# Patient Record
Sex: Male | Born: 1975 | Race: Black or African American | Hispanic: No | Marital: Married | Smoking: Never smoker
Health system: Southern US, Community
[De-identification: ages and names within clinical notes are randomized; demographics above are authoritative.]

## PROBLEM LIST (undated history)

## (undated) DIAGNOSIS — I639 Cerebral infarction, unspecified: Secondary | ICD-10-CM

## (undated) DIAGNOSIS — R569 Unspecified convulsions: Secondary | ICD-10-CM

## (undated) DIAGNOSIS — I10 Essential (primary) hypertension: Secondary | ICD-10-CM

## (undated) HISTORY — PX: FINGER FRACTURE SURGERY: SHX638

---

## 2021-02-19 ENCOUNTER — Encounter (HOSPITAL_COMMUNITY): Payer: Self-pay

## 2021-02-19 ENCOUNTER — Emergency Department (HOSPITAL_COMMUNITY): Payer: Medicare HMO

## 2021-02-19 ENCOUNTER — Other Ambulatory Visit: Payer: Self-pay

## 2021-02-19 ENCOUNTER — Emergency Department (HOSPITAL_COMMUNITY)
Admission: EM | Admit: 2021-02-19 | Discharge: 2021-02-20 | Disposition: A | Discharge status: Home / Self Care | Payer: Medicare HMO | Attending: Emergency Medicine | Admitting: Emergency Medicine

## 2021-02-19 DIAGNOSIS — W108XXA Fall (on) (from) other stairs and steps, initial encounter: Secondary | ICD-10-CM | POA: Diagnosis not present

## 2021-02-19 DIAGNOSIS — S62522A Displaced fracture of distal phalanx of left thumb, initial encounter for closed fracture: Secondary | ICD-10-CM | POA: Diagnosis not present

## 2021-02-19 DIAGNOSIS — M25532 Pain in left wrist: Secondary | ICD-10-CM | POA: Insufficient documentation

## 2021-02-19 DIAGNOSIS — R519 Headache, unspecified: Secondary | ICD-10-CM | POA: Insufficient documentation

## 2021-02-19 DIAGNOSIS — Y9289 Other specified places as the place of occurrence of the external cause: Secondary | ICD-10-CM | POA: Insufficient documentation

## 2021-02-19 DIAGNOSIS — I10 Essential (primary) hypertension: Secondary | ICD-10-CM | POA: Diagnosis not present

## 2021-02-19 DIAGNOSIS — K8020 Calculus of gallbladder without cholecystitis without obstruction: Secondary | ICD-10-CM | POA: Diagnosis not present

## 2021-02-19 DIAGNOSIS — M542 Cervicalgia: Secondary | ICD-10-CM | POA: Insufficient documentation

## 2021-02-19 DIAGNOSIS — Y9301 Activity, walking, marching and hiking: Secondary | ICD-10-CM | POA: Insufficient documentation

## 2021-02-19 DIAGNOSIS — R52 Pain, unspecified: Secondary | ICD-10-CM

## 2021-02-19 HISTORY — DX: Essential (primary) hypertension: I10

## 2021-02-19 HISTORY — DX: Unspecified convulsions: R56.9

## 2021-02-19 HISTORY — DX: Cerebral infarction, unspecified: I63.9

## 2021-02-19 NOTE — ED Triage Notes (Signed)
Pt BIB EMS from home. Pt fell down 5 steps. Pt now endorses head pain and left wrist pain. Pt denies blood thinners and LOC. 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

## 2021-02-20 ENCOUNTER — Emergency Department (HOSPITAL_BASED_OUTPATIENT_CLINIC_OR_DEPARTMENT_OTHER): Payer: Medicare HMO

## 2021-02-20 ENCOUNTER — Other Ambulatory Visit: Payer: Self-pay

## 2021-02-20 ENCOUNTER — Emergency Department (HOSPITAL_BASED_OUTPATIENT_CLINIC_OR_DEPARTMENT_OTHER): Payer: Medicare HMO | Admitting: Radiology

## 2021-02-20 ENCOUNTER — Encounter (HOSPITAL_BASED_OUTPATIENT_CLINIC_OR_DEPARTMENT_OTHER): Payer: Self-pay

## 2021-02-20 ENCOUNTER — Emergency Department (HOSPITAL_BASED_OUTPATIENT_CLINIC_OR_DEPARTMENT_OTHER)
Admission: EM | Admit: 2021-02-20 | Discharge: 2021-02-20 | Disposition: A | Discharge status: Home / Self Care | Payer: Medicare HMO | Source: Home / Self Care | Attending: Emergency Medicine | Admitting: Emergency Medicine

## 2021-02-20 DIAGNOSIS — W108XXA Fall (on) (from) other stairs and steps, initial encounter: Secondary | ICD-10-CM | POA: Insufficient documentation

## 2021-02-20 DIAGNOSIS — S62522A Displaced fracture of distal phalanx of left thumb, initial encounter for closed fracture: Secondary | ICD-10-CM | POA: Insufficient documentation

## 2021-02-20 DIAGNOSIS — Y9301 Activity, walking, marching and hiking: Secondary | ICD-10-CM | POA: Insufficient documentation

## 2021-02-20 DIAGNOSIS — M542 Cervicalgia: Secondary | ICD-10-CM | POA: Insufficient documentation

## 2021-02-20 DIAGNOSIS — R519 Headache, unspecified: Secondary | ICD-10-CM | POA: Insufficient documentation

## 2021-02-20 DIAGNOSIS — I10 Essential (primary) hypertension: Secondary | ICD-10-CM | POA: Insufficient documentation

## 2021-02-20 DIAGNOSIS — W19XXXA Unspecified fall, initial encounter: Secondary | ICD-10-CM

## 2021-02-20 DIAGNOSIS — K8020 Calculus of gallbladder without cholecystitis without obstruction: Secondary | ICD-10-CM | POA: Insufficient documentation

## 2021-02-20 DIAGNOSIS — Y9289 Other specified places as the place of occurrence of the external cause: Secondary | ICD-10-CM | POA: Insufficient documentation

## 2021-02-20 DIAGNOSIS — R29810 Facial weakness: Secondary | ICD-10-CM | POA: Insufficient documentation

## 2021-02-20 LAB — BASIC METABOLIC PANEL
Anion gap: 8 (ref 5–15)
BUN: 13 mg/dL (ref 6–20)
CO2: 25 mmol/L (ref 22–32)
Calcium: 8.8 mg/dL — ABNORMAL LOW (ref 8.9–10.3)
Chloride: 108 mmol/L (ref 98–111)
Creatinine, Ser: 0.69 mg/dL (ref 0.61–1.24)
GFR, Estimated: >60 mL/min (ref >60)
Glucose, Bld: 90 mg/dL (ref 70–99)
Potassium: 3.7 mmol/L (ref 3.5–5.1)
Sodium: 141 mmol/L (ref 135–145)

## 2021-02-20 MED ORDER — IOHEXOL 350 MG/ML SOLN
100.0000 mL | Freq: Once | INTRAVENOUS | Status: AC | PRN
  Administered 2021-02-20: 100 mL via INTRAVENOUS

## 2021-02-20 MED ORDER — HYDROCODONE-ACETAMINOPHEN 5-325 MG PO TABS
1.0000 | ORAL_TABLET | Freq: Once | ORAL | Status: AC
  Administered 2021-02-20: 1 via ORAL
  Filled 2021-02-20: qty 1

## 2021-02-20 MED ORDER — HYDROCODONE-ACETAMINOPHEN 5-325 MG PO TABS: 1.0000 | ORAL_TABLET | ORAL | 0 refills | Status: DC | PRN

## 2021-02-20 NOTE — ED Notes (Signed)
Pt has been transported to CT at this time  

## 2021-02-20 NOTE — ED Notes (Signed)
Pt is back from CT

## 2021-02-20 NOTE — ED Notes (Signed)
ED Provider at bedside. 

## 2021-02-20 NOTE — ED Notes (Signed)
Pt stated that he was not waiting any longer and left the facility.

## 2021-02-20 NOTE — Discharge Instructions (Addendum)
Take the pain medication as prescribed and followup with Dr. Merlyn Lot regarding your thumb fracture. This may need surgery. Do not take the pain medication if you are working or driving as it may make you sleepy. Return to the ED if you develop new or worsening symptoms.   You should also establish care with a local neurologist for ongoing care from your previous stroke to make sure you are on the proper medications to minimize your risk factors of having another stroke.  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  You can see Dr. Darrick Penna of vascular surgery in the office to discuss whether this should be opened or not.

## 2021-02-20 NOTE — Plan of Care (Signed)
Patient briefly discussed with Dr. Manus Gunning, ED provider as a curbside.  Per EDP he had a remote stroke 15 years ago for which no records are available for either of [place] to review and patient/family are unable to give many details of work-up and etiology except potentially there is a vascular injury at the time.  He had a mechanical fall today for which he was evaluated.  He is at his neurological baseline with mild right-sided weakness.  CTA personally reviewed, agree with radiology read  IMPRESSION: 1. Mild right scalp hematoma with no underlying skull fracture. No other acute traumatic injury identified.   2. 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 The distal Left ICA is partially reconstituted from the Pcomm.   3. Suboptimal CTA contrast bolus with no other arterial abnormality identified in the head or neck.  Given clinical history provided by ED physician, no further acute inpatient neurological work-up would be necessary at this time given the patient is at his neurological baseline and further vascular injury has been adequately ruled out by CTA, with head CT also negative for acute intracranial process.  Discussed that the left carotid occlusion is likely chronic given the history provided as well as the patient's current neurological stability.  These are brief curbside recommendations  Brooke Dare MD-PhD [facility] [phone]

## 2021-02-20 NOTE — ED Triage Notes (Signed)
Patient here POV from another ED Facility for a Mechanical Fall.  Patient fell approximately at 2100 down approximately 10-15 carpet-covered steps. Patient has Pain to Head and Left Thumb.   No LOC, No Hx of Blood Thinners, C-Collar in Place, BIB Wheelchair. History Stroke.

## 2021-02-20 NOTE — ED Provider Notes (Signed)
[facility]   CSN: [id number] Arrival date & time: 02/20/21  [id number]     History Chief Complaint  Patient presents with   Marletta LorFall    Jason Mcfarland is a 45 y.o. male.  Patient with right-sided weakness from previous stroke.  Here after mechanical fall around 9:30 PM.  States he normally walks with a cane and lost his balance and fell down approximately 10-15 carpeted steps.  He somehow ended up on his back with his head facing away from the staircase.  Complains of pain to his head, neck and left thumb.  [facility] left without being seen but did have some x-rays and CT scans. Complains of pain to his left head, right neck, and left thumb.  Denies losing consciousness.  Denies any vomiting.  Denies any blood thinner use. No chest pain or abdominal pain.  No back pain.  No visual changes.  The history is provided by the patient and the spouse.  Fall Associated symptoms include headaches. Pertinent negatives include no chest pain, no abdominal pain and no shortness of breath.      Past Medical History:  Diagnosis Date   Hypertension    Seizures (HCC)    Stroke (HCC)     There are no problems to display for this patient.   History reviewed. No pertinent surgical history.     No family history on file.  Social History   Tobacco Use   Smoking status: Never   Smokeless tobacco: Never  Substance Use Topics   Alcohol use: Never   Drug use: Never    Home Medications Prior to Admission medications   Not on File    Allergies    Patient has no known allergies.  Review of Systems   Review of Systems  Constitutional:  Negative for activity change, appetite change, fatigue and fever.  HENT:  Negative for congestion and rhinorrhea.   Respiratory:  Negative for cough, chest tightness and shortness of breath.   Cardiovascular:  Negative for chest pain.  Gastrointestinal:  Negative for abdominal  pain, nausea and vomiting.  Genitourinary:  Negative for dysuria.  Musculoskeletal:  Positive for arthralgias, back pain, myalgias and neck pain.  Skin:  Negative for wound.  Neurological:  Positive for headaches. Negative for dizziness, weakness and light-headedness.   all other systems are negative except as noted in the HPI and PMH.   Physical Exam Updated Vital Signs BP (!) 156/102 (BP Location: Left Arm)   Pulse 72   Temp 98 F (36.7 C) (Oral)   Resp 16   Ht 6\' 6"  (1.981 m)   Wt 108.9 kg   SpO2 98%   BMI 27.74 kg/m   Physical Exam Vitals and nursing note reviewed.  Constitutional:      General: He is not in acute distress.    Appearance: He is well-developed.  HENT:     Head: Normocephalic and atraumatic.     Ears:     Comments: No septal hematoma or hemotympanum    Mouth/Throat:     Pharynx: No oropharyngeal exudate.  Eyes:     Conjunctiva/sclera: Conjunctivae normal.     Pupils: Pupils are equal, round, and reactive to light.  Neck:     Comments: C-collar in place, right paraspinal tenderness, no midline tenderness or step-off Cardiovascular:     Rate and Rhythm: Normal rate and regular rhythm.     Heart sounds: Normal heart sounds. No murmur  heard. Pulmonary:     Effort: Pulmonary effort is normal. No respiratory distress.     Breath sounds: Normal breath sounds.     Comments: No ecchymosis Chest:     Chest wall: No tenderness.  Abdominal:     Palpations: Abdomen is soft.     Tenderness: There is no abdominal tenderness. There is no guarding or rebound.     Comments: No ecchymosis   Musculoskeletal:        General: No tenderness. Normal range of motion.     Cervical back: Normal range of motion and neck supple.     Comments: No T or L-spine tenderness, full range of motion of hips without pain.  Swelling and ecchymosis to left thumb with reduced range of motion.  No snuffbox tenderness.  Full range of motion of bilateral shoulders, elbows, wrists,  hips, knees and ankles.  Skin:    General: Skin is warm.  Neurological:     Mental Status: He is alert and oriented to person, place, and time.     Cranial Nerves: No cranial nerve deficit.     Motor: No abnormal muscle tone.     Coordination: Coordination normal.     Comments: Right-sided facial droop, weakness in right arm and right leg at baseline.  5/5 strength in the left  Psychiatric:        Behavior: Behavior normal.    ED Results / Procedures / Treatments   Labs (all labs ordered are listed, but only abnormal results are displayed) Labs Reviewed  BASIC METABOLIC PANEL - Abnormal; Notable for the following components:      Result Value   Calcium 8.8 (*)    All other components within normal limits    EKG None  Radiology CT Head Wo Contrast  Result Date: 02/19/2021 CLINICAL DATA:  45 year old male with head trauma. EXAM: CT HEAD WITHOUT CONTRAST CT CERVICAL SPINE WITHOUT CONTRAST TECHNIQUE: Multidetector CT imaging of the head and cervical spine was performed following the standard protocol without intravenous contrast. Multiplanar CT image reconstructions of the cervical spine were also generated. COMPARISON:  None. FINDINGS: CT HEAD FINDINGS Brain: There is a large area of old infarct and encephalomalacia involving the left frontal and parietal lobes. There is no acute intracranial hemorrhage. No mass effect or midline shift. No extra-axial fluid collection. Vascular: No hyperdense vessel or unexpected calcification. Skull: Normal. Negative for fracture or focal lesion. Sinuses/Orbits: No acute finding. Other: None CT CERVICAL SPINE FINDINGS Alignment: There is mild reversal of normal cervical lordosis which may be positional or due to muscle spasm. No acute subluxation. Skull base and vertebrae: No acute fracture. Soft tissues and spinal canal: No prevertebral fluid or swelling. No visible canal hematoma. Disc levels: Degenerative changes with disc space narrowing and anterior  osteophyte primarily at C4-C6. Upper chest: Negative. Other: None IMPRESSION: 1. No acute intracranial pathology. 2. Large area of old infarct and encephalomalacia involving the left frontal and parietal lobes. 3. No acute/traumatic cervical spine pathology. Electronically Signed   By: Elgie Collard M.D.   On: 02/19/2021 23:12   CT Cervical Spine Wo Contrast  Result Date: 02/19/2021 CLINICAL DATA:  45 year old male with head trauma. EXAM: CT HEAD WITHOUT CONTRAST CT CERVICAL SPINE WITHOUT CONTRAST TECHNIQUE: Multidetector CT imaging of the head and cervical spine was performed following the standard protocol without intravenous contrast. Multiplanar CT image reconstructions of the cervical spine were also generated. COMPARISON:  None. FINDINGS: CT HEAD FINDINGS Brain: There is a large area  of old infarct and encephalomalacia involving the left frontal and parietal lobes. There is no acute intracranial hemorrhage. No mass effect or midline shift. No extra-axial fluid collection. Vascular: No hyperdense vessel or unexpected calcification. Skull: Normal. Negative for fracture or focal lesion. Sinuses/Orbits: No acute finding. Other: None CT CERVICAL SPINE FINDINGS Alignment: There is mild reversal of normal cervical lordosis which may be positional or due to muscle spasm. No acute subluxation. Skull base and vertebrae: No acute fracture. Soft tissues and spinal canal: No prevertebral fluid or swelling. No visible canal hematoma. Disc levels: Degenerative changes with disc space narrowing and anterior osteophyte primarily at C4-C6. Upper chest: Negative. Other: None IMPRESSION: 1. No acute intracranial pathology. 2. Large area of old infarct and encephalomalacia involving the left frontal and parietal lobes. 3. No acute/traumatic cervical spine pathology. Electronically Signed   By: Elgie Collard M.D.   On: 02/19/2021 23:12   DG Hand Complete Left  Result Date: 02/19/2021 CLINICAL DATA:  Fall down steps.   Left thumb injury. EXAM: LEFT HAND - COMPLETE 3+ VIEW COMPARISON:  None. FINDINGS: Comminuted fracture noted through the distal phalanx of the left thumb with intra-articular extension. No subluxation or dislocation. Soft tissues are intact. IMPRESSION: Comminuted intra-articular fracture of the left thumb distal phalanx. Electronically Signed   By: Charlett Nose M.D.   On: 02/19/2021 23:28    Procedures Procedures   Medications Ordered in ED Medications  HYDROcodone-acetaminophen (NORCO/VICODIN) 5-325 MG per tablet 1 tablet (has no administration in time range)    ED Course  I have reviewed the triage vital signs and the nursing notes.  Pertinent labs & imaging results that were available during my care of the patient were reviewed by me and considered in my medical decision making (see chart for details).    MDM Rules/Calculators/A&P                         Fall downstairs with head, neck, left thumb injury.  No loss of consciousness.  No blood thinner use.  CT head and C-spine obtained in outside facility and are negative for acute pathology. No midline C-spine tenderness and c-collar was removed.  X-ray of hand concerning for intraarticular fracture of distal phalanx of left thumb.  D/w Dr. Merlyn Lot of hand surgery.  Recommends immobilization with aluminum splint and Coban.  The spica splint will not reach fracture site.  He was seen in follow-up.  Is not certain whether patient will need surgery.  Patient and wife are concerned about vascular injury to his neck.  Apparently his previous stroke was caused by this. Discussed that his traumatic imaging is reassuring and his neurological exam is unchanged. Discussed that we can obtain CTA imaging of head and neck to ensure there is no traumatic injury if this will make them feel more comfortable  CTA head and neck negative for acute traumatic injury.  Does show chronic infarction of left renal hemisphere with function occluded left ICA  poorly enhancing left MCA and ACA branches.  Patient reports his initial stroke was in 2004 and he has had right-sided weakness since then.  Does not recall ever being referred to vascular surgeon for endarterectomy. His right-sided weakness today is unchanged and no worse.  D/w Dr. Iver Nestle of neurology who reviewed images.  She agrees that this carotid artery is likely chronically occluded and led to his initial stroke.  He does have collateral blood flow through the circle of Willis.  She  does not recommend any acute intervention.  Normally chronically occluded arteries are not opened as they can cause further damage.  Recommends outpatient follow-up with neurology as well as vascular surgery though she does not believe he would be a candidate for vascular intervention. Patient and wife confirmed that there is no acute change in his neurological exam.  Discussed with patient and wife.  Will refer to local neurology to assess for follow-up to make sure his risk factors for recurrent stroke going forward are minimized. Also referred to vascular surgery to determine if any utility to endarterectomy.  Follow-up with neurology as well as hand surgery. Return precautions discussed  Final Clinical Impression(s) / ED Diagnoses Final diagnoses:  Fall, initial encounter  Closed displaced fracture of distal phalanx of left thumb, initial encounter    Rx / DC Orders ED Discharge Orders     None        Nyjai Graff, Jeannett Senior, MD 02/20/21 [phone]

## 2021-02-22 ENCOUNTER — Encounter: Payer: Self-pay | Admitting: Neurology

## 2021-02-28 ENCOUNTER — Ambulatory Visit (INDEPENDENT_AMBULATORY_CARE_PROVIDER_SITE_OTHER): Payer: Medicare HMO | Admitting: Vascular Surgery

## 2021-02-28 ENCOUNTER — Encounter: Payer: Self-pay | Admitting: Vascular Surgery

## 2021-02-28 ENCOUNTER — Other Ambulatory Visit: Payer: Self-pay

## 2021-02-28 VITALS — BP 147/96 | HR 57 | Temp 98.1°F | Resp 18 | Ht 78.0 in | Wt 251.0 lb

## 2021-02-28 DIAGNOSIS — I6522 Occlusion and stenosis of left carotid artery: Secondary | ICD-10-CM

## 2021-02-28 NOTE — Progress Notes (Signed)
Referring Physician: [facility]  Patient name: Jason Mcfarland MRN: [id number] DOB: 1975/10/05 Sex: male  REASON FOR CONSULT: Left carotid stenosis  HPI: Jason Mcfarland is a 45 y.o. male, recently had a fall with a fracture in his left hand.  He also hit his head.  CT angiogram revealed subtotal functional occlusion of the left internal carotid artery.  He was referred here for further evaluation of this.  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  He has recovered somewhat but still has some difficulty with speech has minimal function of his right arm and his right foot drag some when he walks.  He usually walks with the assistance of a cane.  He is on daily aspirin.  He also has a history of hypertension.  This is currently controlled.  He has had no new neurologic symptoms.  Past Medical History:  Diagnosis Date   Hypertension    Seizures (HCC)    Stroke [facility])    History reviewed. No pertinent surgical history.  History reviewed. No pertinent family history.  SOCIAL HISTORY: Social History   Socioeconomic History   Marital status: Married    Spouse name: Not on file   Number of children: Not on file   Years of education: Not on file   Highest education level: Not on file  Occupational History   Not on file  Tobacco Use   Smoking status: Never   Smokeless tobacco: Never  Substance and Sexual Activity   Alcohol use: Never   Drug use: Never   Sexual activity: Yes  Other Topics Concern   Not on file  Social History Narrative   Not on file   Social Determinants of Health   Financial Resource Strain: Not on file  Food Insecurity: Not on file  Transportation Needs: Not on file  Physical Activity: Not on file  Stress: Not on file  Social Connections: Not on file  Intimate Partner Violence: Not on file    No Known Allergies  Current Outpatient Medications  Medication Sig Dispense Refill   amLODipine (NORVASC) 5 MG tablet  Take by mouth.     aspirin 81 MG EC tablet Take by mouth.     HYDROcodone-acetaminophen (NORCO/VICODIN) 5-325 MG tablet Take 1 tablet by mouth every 4 (four) hours as needed. 10 tablet 0   phenytoin (DILANTIN) 100 MG ER capsule Take 3 capsules by mouth 2 (two) times daily.     sertraline (ZOLOFT) 50 MG tablet Take by mouth.     No current facility-administered medications for this visit.    ROS:   General:  No weight loss, Fever, chills  HEENT: No recent headaches, no nasal bleeding, no visual changes, no sore throat  Neurologic: No dizziness, blackouts, seizures. No recent symptoms of stroke or mini- stroke. No recent episodes of slurred speech, or temporary blindness.  Cardiac: No recent episodes of chest pain/pressure, no shortness of breath at rest.  No shortness of breath with exertion.  Denies history of atrial fibrillation or irregular heartbeat  Vascular: No history of rest pain in feet.  No history of claudication.  No history of non-healing ulcer, No history of DVT   Pulmonary: No home oxygen, no productive cough, no hemoptysis,  No asthma or wheezing  Musculoskeletal:  [ ]  Arthritis, [ ]  Low back pain,  [ ]  Joint pain  Hematologic:No history of hypercoagulable state.  No history of easy bleeding.  No history of anemia  Gastrointestinal: No hematochezia  or melena,  No gastroesophageal reflux, no trouble swallowing  Urinary: [ ]  chronic Kidney disease, [ ]  on HD - [ ]  MWF or [ ]  TTHS, [ ]  Burning with urination, [ ]  Frequent urination, [ ]  Difficulty urinating;   Skin: No rashes  Psychological: No history of anxiety,  No history of depression   Physical Examination  Vitals:   02/28/21 1129  BP: (!) 147/96  Pulse: (!) 57  Resp: 18  Temp: 98.1 F (36.7 C)  TempSrc: Temporal  SpO2: 97%  Weight: 251 lb (113.9 kg)  Height: 6\' 6"  (1.981 m)    Body mass index is 29.01 kg/m.  General:  Alert and oriented, no acute distress HEENT: Normal Neck: No JVD Cardiac:  Regular Rate and Rhythm Skin: No rash Musculoskeletal: No deformity or edema  Neurologic: Upper and lower extremity motor left side 5/5, right side flaccid paralysis right hand some movement of the right elbow and shoulder 4/5 motor function in the right leg minimal function of the right foot and ankle left side has totally normal neurologic exam.  He has some facial asymmetry with right facial droop and numbness.    DATA:  Reviewed the images from the patient's recent CT angio of the head and neck dated February 20, 2021.  This shows subtotal occlusion within the cavernous portion of the left internal carotid artery and basically is functionally occluded.  Vertebral arteries are patent.  Right carotid system has mild tortuosity but no narrowing.  ASSESSMENT: Functionally occluded left internal carotid artery which probably caused his stroke 18 years ago.  Although I cannot definitively say this the age and timing of this probably were related to a carotid dissection rather than atherosclerotic disease.  No intervention for this currently.  Risk of recurrent stroke from this carotid artery should be fairly minimal.   PLAN: Patient could continue to modify his risk factors by making sure his cholesterol stays low controlling his blood pressure and continuing daily aspirin.  All of these were discussed with the patient's wife by phone as well.  They would like to have intermittent surveillance to make sure that he does not develop a new problem with his right carotid artery.  In light of this we will arrange for a follow-up visit in 1 year time in our APP clinic and a carotid duplex exam.  He will follow-up sooner if he has any symptoms.   , MD [facility] Office: [phone]

## 2021-05-15 ENCOUNTER — Ambulatory Visit: Payer: Medicare HMO | Admitting: Neurology

## 2021-08-07 NOTE — Progress Notes (Signed)
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  Discussed potential for rebound headache and should be taken sparingly.  Advised not to take such high doses of ibuprofen due to bleeding risk, effects on kidneys and GI upset. 2  Currently taking phenytoin ER 300mg  twice daily.  Managed and monitored by his PCP.  Discussed possibly changing to an alternative AED such as levetiracetam.  As he has been doing well on phenytoin and doesn't want to risk a seizure, he declines which is fine.  3  Secondary stroke prevention as managed by PCP:  - ASA 81mg  daily  - Normotensive blood pressure  - cholesterol and glycemic control 4  Follow up in 7-8 months.   Subjective:  Jason Mcfarland is a 45 year old male with HTN and residual right sided weakness due to previous stroke who presents for headache.  History supplemented by his wife via telephone and by ED note.  Patient sustained a left hemispheric stroke in 2004.  He was  hit while playing basketball.  Afterwards, he collapsed and had a stroke.  He was briefly on Coumadin which was discontinued due to bleeding.  Soon after the stroke, he started having seizures.  He was started on phenytoin and hasn't had a recurrent seizure since then.  His PCP monitors his labs and level.  He has residual expressive aphasia and right hemiparesis due to the stroke.  Since the stroke he has had  headaches.  On 02/19/2021, he slipped down the stairs and hit his head.  He was seen in the ED.  CT head showed old large left frontal and parietal infarct but no acute findings.  CT cervical spine showed degenerative changes, primarily at C4-6 but no acute fractures.  CTA head and neck showed occluded left ICA with poorly enhancing left MCA and ACA branches, likely chronic.  He did follow up with vascular surgery who recommended monitoring via carotid ultrasound.  Since the fall, headaches have gotten worse.  He describes severe headache occurring once a month.  No associated nausea, vomiting, photophobia, phonophobia or visual disturbance.  Treats with four 800mg  ibuprofen.    Current medications:  phenytoin ER 300mg  twice daily, sertraline 50mg  daily, hydrocodone-acetaminophen, amlodipine, ASA 81mg  daily   PAST MEDICAL HISTORY: Past Medical History:  Diagnosis Date   Hypertension    Seizures (HCC)    Stroke (HCC)     PAST SURGICAL HISTORY: No past surgical history on file.  MEDICATIONS: Current Outpatient Medications on File Prior to Visit  Medication Sig Dispense Refill   amLODipine (NORVASC) 5 MG tablet Take by mouth.     aspirin 81 MG EC tablet Take by mouth.     HYDROcodone-acetaminophen (NORCO/VICODIN) 5-325 MG tablet Take 1 tablet by mouth every 4 (four) hours as needed. 10 tablet 0   phenytoin (DILANTIN) 100 MG ER capsule Take 3 capsules by mouth 2 (two) times daily.     sertraline (ZOLOFT) 50 MG tablet Take by mouth.     No current facility-administered medications on file prior to visit.    ALLERGIES: No  Known Allergies  FAMILY HISTORY: No family history on file.  Objective:  Blood pressure (!) 155/90, pulse 91, height 6\' 6"  (1.981 m), weight 265 lb (120.2 kg), SpO2 97 %. General: No acute distress.  Patient appears well-groomed.   Head:  Normocephalic/atraumatic Eyes:  fundi examined but not visualized Neck: supple, no paraspinal tenderness, full range of  motion Back: No paraspinal tenderness Heart: regular rate and rhythm Lungs: Clear to auscultation bilaterally. Vascular: No carotid bruits. Neurological Exam: Mental status: alert and oriented to person, place, and time, expressive aphasia.  Not dysarthric. Cranial nerves: CN I: not tested CN II: pupils equal, round and reactive to light, visual fields intact CN III, IV, VI:  full range of motion, no nystagmus, no ptosis CN V: facial sensation reduced right V1-V3 CN VII: upper and lower face symmetric CN VIII: hearing intact CN IX, X: gag intact, uvula midline CN XI: sternocleidomastoid and trapezius muscles intact CN XII: tongue midline Bulk & Tone: increased right upper and lower extremities Motor:  muscle strength 4/5 right upper extremity, right foot drop, otherwise 5/5 throughout Sensation:  Pinprick, temperature and vibratory sensation intact. Deep Tendon Reflexes:  3+ right upper and lower extremities, 2+ left upper and lower extremities,  toes downgoing.   Finger to nose testing:  Slow on right but without dysmetria.    Gait:  Right hemiplegic gait.  Romberg negative.    Thank you for allowing me to take part in the care of this patient.  Metta Clines, DO   CC:  Wilburn Mylar, MD

## 2021-08-08 ENCOUNTER — Encounter: Payer: Self-pay | Admitting: Neurology

## 2021-08-08 ENCOUNTER — Other Ambulatory Visit: Payer: Self-pay

## 2021-08-08 ENCOUNTER — Ambulatory Visit: Payer: Medicare HMO | Admitting: Neurology

## 2021-08-08 VITALS — BP 155/90 | HR 91 | Ht 78.0 in | Wt 265.0 lb

## 2021-08-08 DIAGNOSIS — I69351 Hemiplegia and hemiparesis following cerebral infarction affecting right dominant side: Secondary | ICD-10-CM

## 2021-08-08 DIAGNOSIS — I69320 Aphasia following cerebral infarction: Secondary | ICD-10-CM | POA: Diagnosis not present

## 2021-08-08 DIAGNOSIS — I63232 Cerebral infarction due to unspecified occlusion or stenosis of left carotid arteries: Secondary | ICD-10-CM

## 2021-08-08 DIAGNOSIS — R519 Headache, unspecified: Secondary | ICD-10-CM

## 2021-08-08 DIAGNOSIS — G40109 Localization-related (focal) (partial) symptomatic epilepsy and epileptic syndromes with simple partial seizures, not intractable, without status epilepticus: Secondary | ICD-10-CM | POA: Diagnosis not present

## 2021-08-08 DIAGNOSIS — I10 Essential (primary) hypertension: Secondary | ICD-10-CM

## 2021-08-08 MED ORDER — BUTALBITAL-APAP-CAFFEINE 50-325-40 MG PO TABS: 1.0000 | ORAL_TABLET | Freq: Four times a day (QID) | ORAL | 5 refills | Status: AC | PRN

## 2021-08-08 NOTE — Patient Instructions (Signed)
Take butalbital-caffeine-acetaminophen as directed. Take no more than 2 days out of the month.  Do not take ibuprofen. Limit use of pain relievers to no more than 2 days out of week to prevent risk of rebound or medication-overuse headache. Follow up 7-8 months

## 2021-08-09 ENCOUNTER — Encounter: Payer: Self-pay | Admitting: Neurology

## 2021-12-08 IMAGING — CT CT ANGIO NECK
1 of 13 series · 4 of 33 positions shown · IV contrast (APPLIED)
Comparison: [HOSPITAL] head and cervical spine CT
02/19/2021

CLINICAL DATA: 45-year-old male status post fall down steps at 6088
hours. Persistent pain.

EXAM:
CT ANGIOGRAPHY HEAD AND NECK
TECHNIQUE: Multidetector CT imaging of the head and neck was performed using
the standard protocol during bolus administration of intravenous
contrast. Multiplanar CT image reconstructions and MIPs were
obtained to evaluate the vascular anatomy. Carotid stenosis
measurements (when applicable) are obtained utilizing NASCET
criteria, using the distal internal carotid diameter as the
denominator.
CONTRAST:  100mL OMNIPAQUE IOHEXOL 350 MG/ML SOLN

[Series 13: cta head & neck · axial · 0.59mm/px · z∈[-327,-116]mm · 4 of 741 slices shown]
[im 149/741  soft-tissue]
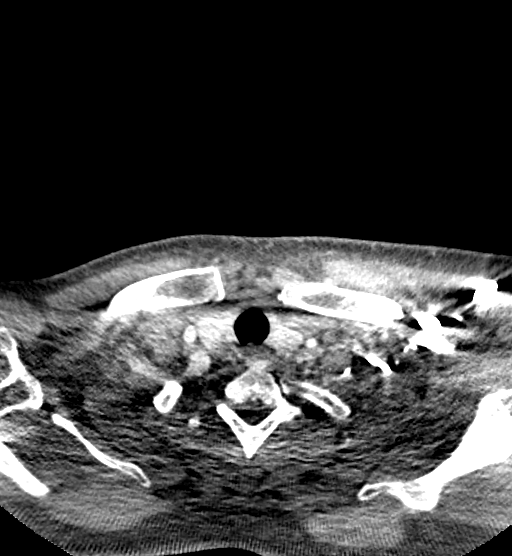
[im 297/741  bone]
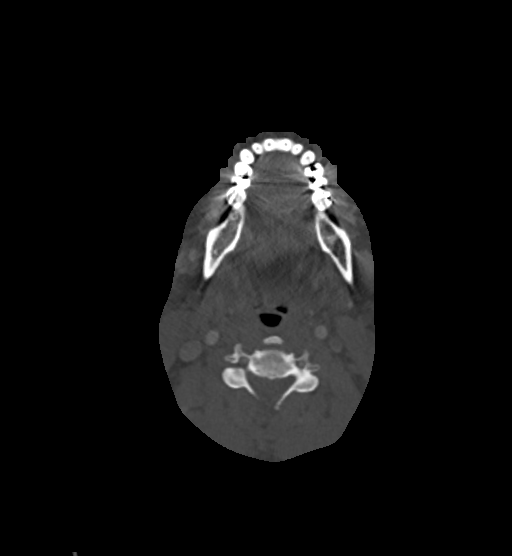
[im 445/741  soft-tissue]
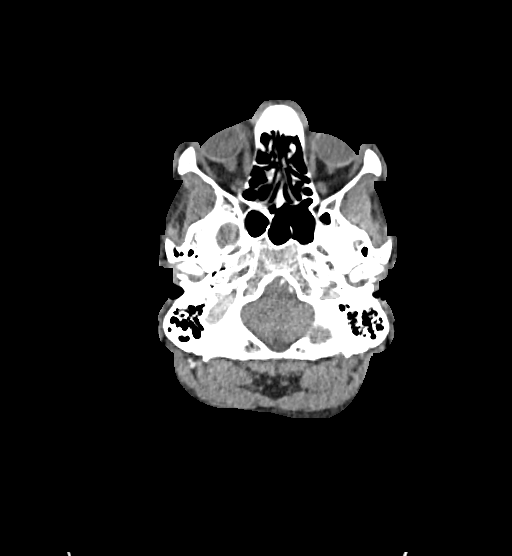
[im 593/741  bone]
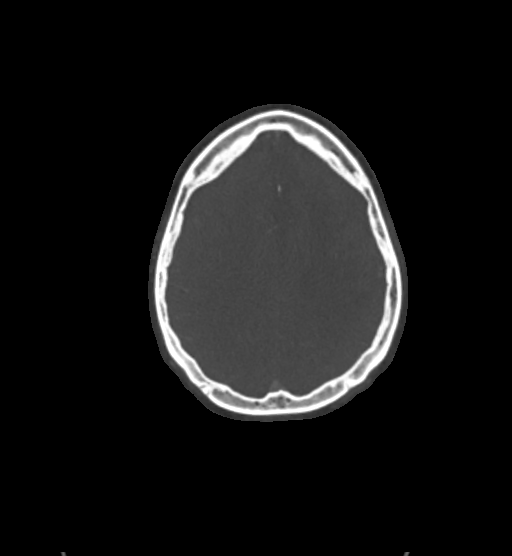

[4 of 33 positions shown; findings below may reference images not displayed]

FINDINGS: CT HEAD

Brain: Pronounced left frontal and parietal lobe encephalomalacia
with ex vacuo enlargement of the left lateral ventricle, and trace
midline shift toward the smaller hemisphere. Stable gray-white
matter differentiation throughout the brain.

No ventriculomegaly, evidence of mass lesion, intracranial
hemorrhage, or evidence of cortically based acute infarction.

Calvarium and skull base: Stable and intact.

Paranasal sinuses: Visualized paranasal sinuses and mastoids are
stable and well aerated.

Orbits: Right superior convexity mild scalp hematoma or contusion on
series 6, image 28 is not significantly changed. Underlying
calvarium intact. Visualized orbit soft tissues are within normal
limits.

CTA NECK

Skeleton: Stable reversal of cervical lordosis since 4400 hours
yesterday. No acute osseous abnormality identified.

Upper chest: Minor atelectasis.

Other neck: The glottis is closed. No discrete soft tissue injury is
identified in the neck.

Aortic arch: Suboptimal contrast bolus timing. Three vessel arch
configuration with no arch atherosclerosis.

Right carotid system: Negative aside from mild tortuosity of the
right ICA just below the skull base.

Left carotid system: Suboptimal contrast bolus timing but the left
CCA appears to be patent and within normal limits to the
bifurcation. At the bifurcation the left ICA origin and bulb are
patent, but the vessel abruptly tapers to a string sign at the
distal bulb (series 19, image 35) and remains thread-like to the
skull base (series 12, image 81).

Vertebral arteries:
Proximal right subclavian artery and right vertebral artery origin
appear normal. The right vertebral is patent to the skull base and
within normal limits.

Suboptimal contrast timing. The proximal left subclavian artery and
left vertebral artery origin appear grossly normal. The left
vertebral artery appears non dominant but is patent and within
normal limits to the skull base.

CTA HEAD

Posterior circulation: Patent distal vertebral arteries and basilar
with no plaque or stenosis identified. Patent basilar tip, PCA
origins. Bilateral posterior communicating arteries are present.
Left PCA branches are within normal limits. The right PCA branches
appear mildly irregular but otherwise normal.

Anterior circulation: Poorly enhancing left ICA siphon proximal to
the supraclinoid segment where some reconstitution from the left
posterior communicating artery is suspected. Contralateral right ICA
siphon is patent with no plaque or stenosis identified. Both
posterior communicating artery origins appear normal.

Patent carotid termini, although dominant right and diminutive or
absent left ACA A1 segments. And furthermore the left ACA is
diminutive and poorly enhancing beyond the proximal A2 segment as
seen on series 18, image 18. contralateral right ACA branches are
within normal limits.

Right MCA M1 segment and bifurcation are patent without stenosis.
Right MCA branches appear within normal limits.

Contralateral left MCA M1 is patent but diminutive (series 17, image
25) with a tapered appearance of the left MCA bi/trifurcation. And
decreased size and enhancement of all left MCA branches (series 19,
image 35).

Venous sinuses: Patent.

Anatomic variants: Dominant right and diminutive or absent left ACA
A1 segments.

Review of the MIP images confirms the above findings
IMPRESSION: 1. Mild right scalp hematoma with no underlying skull fracture. No
other acute traumatic injury identified.

2. Positive for chronic infarction of the left cerebral hemisphere
with a functionally occluded cervical left ICA, and poorly enhancing
left MCA and ACA branches. The distal Left ICA is partially
reconstituted from the Pcomm.

3. Suboptimal CTA contrast bolus with no other arterial abnormality
identified in the head or neck.

## 2022-03-28 ENCOUNTER — Ambulatory Visit
Admission: EM | Admit: 2022-03-28 | Discharge: 2022-03-28 | Disposition: A | Discharge status: Home / Self Care | Payer: Medicare HMO | Attending: Physician Assistant | Admitting: Physician Assistant

## 2022-03-28 DIAGNOSIS — H60502 Unspecified acute noninfective otitis externa, left ear: Secondary | ICD-10-CM | POA: Diagnosis not present

## 2022-03-28 DIAGNOSIS — H6122 Impacted cerumen, left ear: Secondary | ICD-10-CM | POA: Diagnosis not present

## 2022-03-28 MED ORDER — OFLOXACIN 0.3 % OT SOLN: 5.0000 [drp] | Freq: Two times a day (BID) | OTIC | 0 refills | Status: AC

## 2022-03-28 NOTE — Discharge Instructions (Signed)
We were able to clean the earwax out of your ear.  Please avoid using Q-tips, earbuds, earplugs.  Your ear canal looked irritated likely from this procedure.  Use ofloxacin twice daily.  Keep ear facing upward a few minutes after applying medication to allow it to completely penetrate through the ear canal.  If symptoms recur you can use Debrox over-the-counter.  If you develop any worsening symptoms including drainage from the ear, pain, fever, nausea, vomiting you should be seen immediately.

## 2022-03-28 NOTE — ED Provider Notes (Signed)
[facility]    CSN: [id number] Arrival date & time: 03/28/22  1320      History   Chief Complaint Chief Complaint  Patient presents with   Ear Fullness    HPI Jason Mcfarland is a 46 y.o. male.   Patient presents today with a weeklong history of left ear fullness.  He reports decreased hearing.  He has a history of recurrent cerumen impaction with similar presentation.  He does report using Q-tips occasionally but does not use earbuds or earplugs.  Denies any recent swimming.  Denies any recent airplane travel or barotrauma.  He denies any recent illness or additional symptoms including otalgia, otorrhea, fever, nausea, vomiting.  He has not seen an ENT recently.  He has not tried any over-the-counter medication for symptom management.    Past Medical History:  Diagnosis Date   Hypertension    Seizures (HCC)    Stroke (HCC)     There are no problems to display for this patient.   Past Surgical History:  Procedure Laterality Date   FINGER FRACTURE SURGERY         Home Medications    Prior to Admission medications   Medication Sig Start Date End Date Taking? Authorizing Provider  ofloxacin (FLOXIN) 0.3 % OTIC solution Place 5 drops into the left ear 2 (two) times daily. 03/28/22  Yes Shane Badeaux K, PA-C  amLODipine (NORVASC) 5 MG tablet Take by mouth. 01/19/21   [provider]  aspirin 81 MG EC tablet Take by mouth.    [provider]  butalbital-acetaminophen-caffeine (FIORICET) [phone] MG tablet Take 1-2 tablets by mouth every 6 (six) hours as needed for headache. 08/08/21 08/08/22  Drema Dallas, DO  phenytoin (DILANTIN) 100 MG ER capsule Take 3 capsules by mouth 2 (two) times daily. 11/29/20   [provider]  sertraline (ZOLOFT) 50 MG tablet Take by mouth. 01/15/21   [provider]  tadalafil (CIALIS) 20 MG tablet Take 20 mg by mouth as needed. 01/19/21   [provider]    Family History Family History   Problem Relation Age of Onset   Seizures Father     Social History Social History   Tobacco Use   Smoking status: Never   Smokeless tobacco: Never  Substance Use Topics   Alcohol use: Never   Drug use: Never     Allergies   Patient has no known allergies.   Review of Systems Review of Systems  Constitutional:  Positive for activity change. Negative for appetite change, fatigue and fever.  HENT:  Positive for hearing loss. Negative for congestion, ear discharge, ear pain, sinus pressure, sneezing and sore throat.   Respiratory:  Negative for cough and shortness of breath.   Cardiovascular:  Negative for chest pain.     Physical Exam Triage Vital Signs ED Triage Vitals  Enc Vitals Group     BP 03/28/22 1444 (!) 138/94     Pulse Rate 03/28/22 1444 95     Resp 03/28/22 1444 18     Temp 03/28/22 1444 97.7 F (36.5 C)     Temp Source 03/28/22 1444 Oral     SpO2 03/28/22 1444 97 %     Weight --      Height --      Head Circumference --      Peak Flow --      Pain Score 03/28/22 1443 0     Pain Loc --  Pain Edu? --      Excl. in GC? --    No data found.  Updated Vital Signs BP (!) 138/94 (BP Location: Left Arm) Comment: no blood draws or sticks to right arm (hx of stroke)  Pulse 95   Temp 97.7 F (36.5 C) (Oral)   Resp 18   SpO2 97%   Visual Acuity Right Eye Distance:   Left Eye Distance:   Bilateral Distance:    Right Eye Near:   Left Eye Near:    Bilateral Near:     Physical Exam Vitals reviewed.  Constitutional:      General: He is awake.     Appearance: Normal appearance. He is well-developed. He is not ill-appearing.     Comments: Very pleasant male appears stated age in no acute distress sitting comfortably in exam room  HENT:     Head: Normocephalic and atraumatic.     Right Ear: Tympanic membrane, ear canal and external ear normal. Tympanic membrane is not erythematous or bulging.     Left Ear: Ear canal and external ear normal. A  middle ear effusion is present.     Ears:     Comments: Cerumen impaction noted in left ear; unable to visualize TM.  Cerumen resolved with in office irrigation revealing normal TM but erythematous ear canal.    Nose: Nose normal.     Mouth/Throat:     Pharynx: Uvula midline. Posterior oropharyngeal erythema present. No oropharyngeal exudate or uvula swelling.  Cardiovascular:     Rate and Rhythm: Normal rate and regular rhythm.     Heart sounds: Normal heart sounds, S1 normal and S2 normal. No murmur heard. Pulmonary:     Effort: Pulmonary effort is normal. No accessory muscle usage or respiratory distress.     Breath sounds: Normal breath sounds. No stridor. No wheezing, rhonchi or rales.     Comments: Clear to auscultation bilaterally Abdominal:     General: Bowel sounds are normal.     Palpations: Abdomen is soft.     Tenderness: There is no abdominal tenderness.  Neurological:     Mental Status: He is alert.  Psychiatric:        Behavior: Behavior is cooperative.      UC Treatments / Results  Labs (all labs ordered are listed, but only abnormal results are displayed) Labs Reviewed - No data to display  EKG   Radiology No results found.  Procedures Procedures (including critical care time)  Medications Ordered in UC Medications - No data to display  Initial Impression / Assessment and Plan / UC Course  I have reviewed the triage vital signs and the nursing notes.  Pertinent labs & imaging results that were available during my care of the patient were reviewed by me and considered in my medical decision making (see chart for details).     Cerumen impaction resolved with in office irrigation.  Ear canal appeared erythematous and edematous likely from procedure.  Will cover with ofloxacin to ensure patient does not develop otitis externa.  Discussed that he should apply these drops twice daily and keep ear facing upward for several minutes after application of drops.   He can use Tylenol and ibuprofen for pain.  Discussed that he should avoid Q-tips or placing anything in his ear.  He is to avoid submerging his head in water until symptoms improve.  Discussed that if he has recurrent wax buildup he can use the Debrox on a regular basis to  help prevent this/treated.  If symptoms are not improving he is to follow-up with ENT.  Discussed that if he has any worsening symptoms he should be seen immediately.  Strict return precautions given.  Final Clinical Impressions(s) / UC Diagnoses   Final diagnoses:  Hearing loss due to cerumen impaction, left  Impacted cerumen of left ear  Acute otitis externa of left ear, unspecified type     Discharge Instructions      We were able to clean the earwax out of your ear.  Please avoid using Q-tips, earbuds, earplugs.  Your ear canal looked irritated likely from this procedure.  Use ofloxacin twice daily.  Keep ear facing upward a few minutes after applying medication to allow it to completely penetrate through the ear canal.  If symptoms recur you can use Debrox over-the-counter.  If you develop any worsening symptoms including drainage from the ear, pain, fever, nausea, vomiting you should be seen immediately.     ED Prescriptions     Medication Sig Dispense Auth. Provider   ofloxacin (FLOXIN) 0.3 % OTIC solution Place 5 drops into the left ear 2 (two) times daily. 5 mL Zadyn Yardley K, PA-C      PDMP not reviewed this encounter.   Jeani Hawking, PA-C 03/28/22 1538

## 2022-03-28 NOTE — ED Triage Notes (Signed)
The patient states his left ear feels clogged and states he is not able to hear from it (started: about a week ago). The patient has been using qtips.

## 2022-04-10 ENCOUNTER — Ambulatory Visit: Payer: Medicare HMO | Admitting: Neurology

## 2022-05-28 ENCOUNTER — Other Ambulatory Visit: Payer: Self-pay | Admitting: *Deleted

## 2022-05-28 DIAGNOSIS — I6522 Occlusion and stenosis of left carotid artery: Secondary | ICD-10-CM

## 2022-05-30 NOTE — Progress Notes (Unsigned)
Office Note     CC:  follow up Requesting Provider:  Wilburn Mylar, MD  HPI: Jason Mcfarland is a 46 y.o. (Jan 23, 1976) male who presents for routine follow up of carotid artery stenosis. He has known left ICA occlusion. Patient had a stroke almost 20 years ago with resulting right hemiplegia.  He has recovered somewhat but still has some difficulty with speech has minimal function of his right arm and his right foot drag some when he walks.  He usually walks with the assistance of a cane.   He denies any blurred vision, slurred speech, facial drooping, new unilateral upper or lower extremity weakness or numbness.   The pt is not on a statin for cholesterol management.  The pt is on a daily aspirin.   Other AC: none The pt is on CCB for hypertension.   The pt is not diabetic.   Tobacco hx:  never  Past Medical History:  Diagnosis Date   Hypertension    Seizures ([place])    Stroke [facility])     Past Surgical History:  Procedure Laterality Date   FINGER FRACTURE SURGERY      Social History   Socioeconomic History   Marital status: Married    Spouse name: Not on file   Number of children: Not on file   Years of education: Not on file   Highest education level: Not on file  Occupational History   Not on file  Tobacco Use   Smoking status: Never   Smokeless tobacco: Never  Substance and Sexual Activity   Alcohol use: Never   Drug use: Never   Sexual activity: Yes  Other Topics Concern   Not on file  Social History Narrative   Right handed   Social Determinants of Health   Financial Resource Strain: Not on file  Food Insecurity: Not on file  Transportation Needs: Not on file  Physical Activity: Not on file  Stress: Not on file  Social Connections: Not on file  Intimate Partner Violence: Not on file    Family History  Problem Relation Age of Onset   Seizures Father     Current Outpatient Medications  Medication Sig Dispense Refill   amLODipine (NORVASC) 5 MG  tablet Take by mouth.     aspirin 81 MG EC tablet Take by mouth.     butalbital-acetaminophen-caffeine (FIORICET) 50-325-40 MG tablet Take 1-2 tablets by mouth every 6 (six) hours as needed for headache. 8 tablet 5   ofloxacin (FLOXIN) 0.3 % OTIC solution Place 5 drops into the left ear 2 (two) times daily. 5 mL 0   phenytoin (DILANTIN) 100 MG ER capsule Take 3 capsules by mouth 2 (two) times daily.     sertraline (ZOLOFT) 50 MG tablet Take by mouth.     tadalafil (CIALIS) 20 MG tablet Take 20 mg by mouth as needed.     No current facility-administered medications for this visit.    No Known Allergies   REVIEW OF SYSTEMS:  [X]  denotes positive finding, [ ]  denotes negative finding Cardiac  Comments:  Chest pain or chest pressure:    Shortness of breath upon exertion:    Short of breath when lying flat:    Irregular heart rhythm:        Vascular    Pain in calf, thigh, or hip brought on by ambulation:    Pain in feet at night that wakes you up from your sleep:     Blood clot in your veins:  Leg swelling:         Pulmonary    Oxygen at home:    Productive cough:     Wheezing:         Neurologic    Sudden weakness in arms or legs:     Sudden numbness in arms or legs:     Sudden onset of difficulty speaking or slurred speech:    Temporary loss of vision in one eye:     Problems with dizziness:         Gastrointestinal    Blood in stool:     Vomited blood:         Genitourinary    Burning when urinating:     Blood in urine:        Psychiatric    Major depression:         Hematologic    Bleeding problems:    Problems with blood clotting too easily:        Skin    Rashes or ulcers:        Constitutional    Fever or chills:      PHYSICAL EXAMINATION:  There were no vitals filed for this visit.  General:  WDWN in NAD; vital signs documented above Gait: Not observed HENT: WNL, normocephalic Pulmonary: normal non-labored breathing , without Rales, rhonchi,   wheezing Cardiac: {Desc; regular/irreg:14544} HR, without  Murmurs {With/Without:20273} carotid bruit*** Abdomen: soft, NT, no masses Skin: {With/Without:20273} rashes Vascular Exam/Pulses:  Right Left  Radial {Exam; arterial pulse strength 0-4:30167} {Exam; arterial pulse strength 0-4:30167}  Ulnar {Exam; arterial pulse strength 0-4:30167} {Exam; arterial pulse strength 0-4:30167}  Femoral {Exam; arterial pulse strength 0-4:30167} {Exam; arterial pulse strength 0-4:30167}  Popliteal {Exam; arterial pulse strength 0-4:30167} {Exam; arterial pulse strength 0-4:30167}  DP {Exam; arterial pulse strength 0-4:30167} {Exam; arterial pulse strength 0-4:30167}  PT {Exam; arterial pulse strength 0-4:30167} {Exam; arterial pulse strength 0-4:30167}   Extremities: {With/Without:20273} ischemic changes, {With/Without:20273} Gangrene , {With/Without:20273} cellulitis; {With/Without:20273} open wounds;  Musculoskeletal: no muscle wasting or atrophy  Neurologic: A&O X 3;  No focal weakness or paresthesias are detected Psychiatric:  The pt has {Desc; normal/abnormal:11317::"Normal"} affect.   Non-Invasive Vascular Imaging:   ***    ASSESSMENT/PLAN:: 46 y.o. male here for follow up for carotid stenosis. He has known left ICA occlusion. Right ICA is ***  - Continue Aspirin and  - He can follow up in 1 year with repeat carotid duplex   Karoline Caldwell, PA-C [facility] MD:   Trula Slade

## 2022-06-03 ENCOUNTER — Ambulatory Visit: Payer: Medicare HMO

## 2022-06-03 ENCOUNTER — Ambulatory Visit (HOSPITAL_COMMUNITY): Payer: Medicare HMO | Attending: Surgery

## 2022-06-03 DIAGNOSIS — I6522 Occlusion and stenosis of left carotid artery: Secondary | ICD-10-CM
